# Patient Record
Sex: Female | Born: 1965 | Race: Black or African American | Hispanic: No | Marital: Married | Smoking: Current every day smoker
Health system: Southern US, Community
[De-identification: ages and names within clinical notes are randomized; demographics above are authoritative.]

## PROBLEM LIST (undated history)

## (undated) DIAGNOSIS — K589 Irritable bowel syndrome without diarrhea: Secondary | ICD-10-CM

## (undated) DIAGNOSIS — I10 Essential (primary) hypertension: Secondary | ICD-10-CM

## (undated) HISTORY — PX: ABDOMINAL HYSTERECTOMY: SHX81

## (undated) HISTORY — PX: LAPAROSCOPIC OOPHERECTOMY: SHX6507

## (undated) HISTORY — PX: CHOLECYSTECTOMY: SHX55

## (undated) HISTORY — PX: MENISCUS REPAIR: SHX5179

---

## 2013-08-21 ENCOUNTER — Ambulatory Visit: Payer: Self-pay | Admitting: Emergency Medicine

## 2015-02-02 ENCOUNTER — Ambulatory Visit
Admission: EM | Admit: 2015-02-02 | Discharge: 2015-02-02 | Disposition: A | Discharge status: Other Acute Inpatient Hospital | Payer: BLUE CROSS/BLUE SHIELD | Attending: Family Medicine | Admitting: Family Medicine

## 2015-02-02 DIAGNOSIS — S6990XA Unspecified injury of unspecified wrist, hand and finger(s), initial encounter: Secondary | ICD-10-CM

## 2015-02-02 HISTORY — DX: Essential (primary) hypertension: I10

## 2015-02-02 HISTORY — DX: Irritable bowel syndrome, unspecified: K58.9

## 2015-02-02 NOTE — ED Notes (Signed)
Pt reports she fell on concrete last Thursday. The affected finger is the middle finger on her left hand. Pt reports she noticed swelling and pain.

## 2015-02-27 IMAGING — CR RIGHT THUMB 2+V
1 series · 3 of 3 positions shown · non-contrast
Comparison: None.

CLINICAL DATA: Infection along thumb nail.  Pain.

EXAM:
RIGHT THUMB 2+V

[Series 1: pa · 0.17mm/px · 3 of 3 slices shown]
[im 1/3]
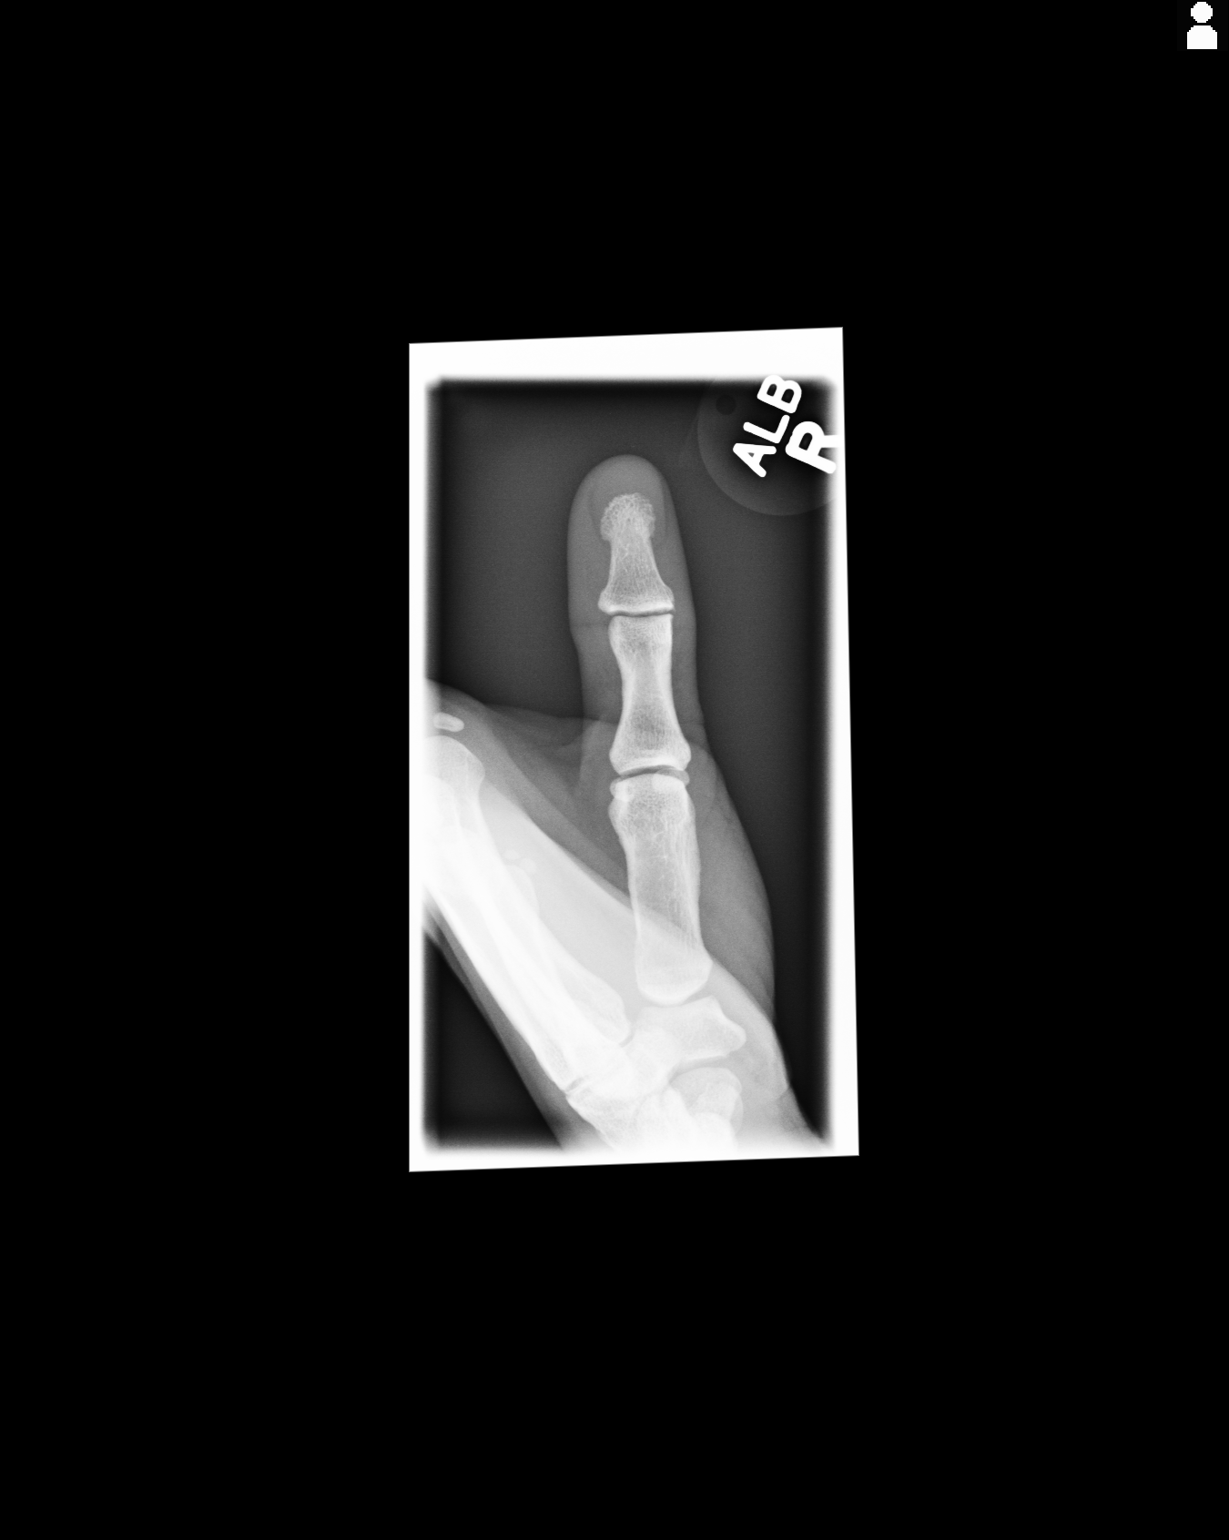
[im 2/3]
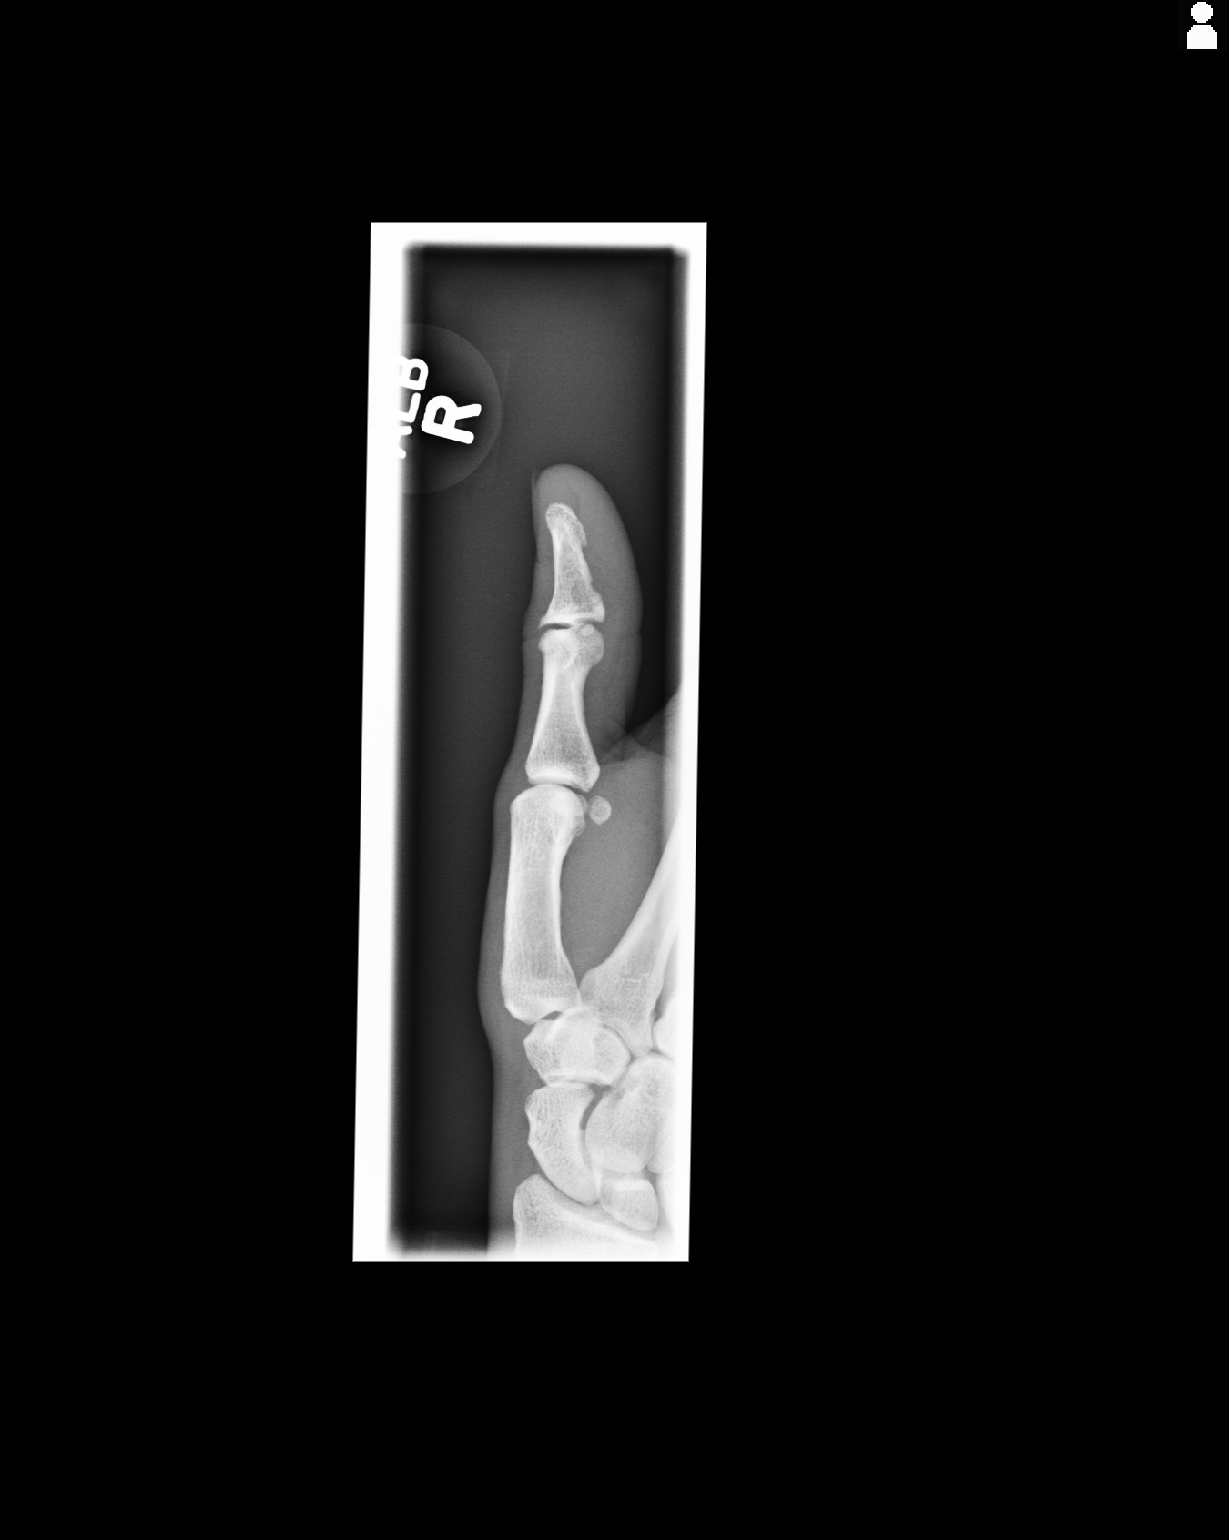
[im 3/3]
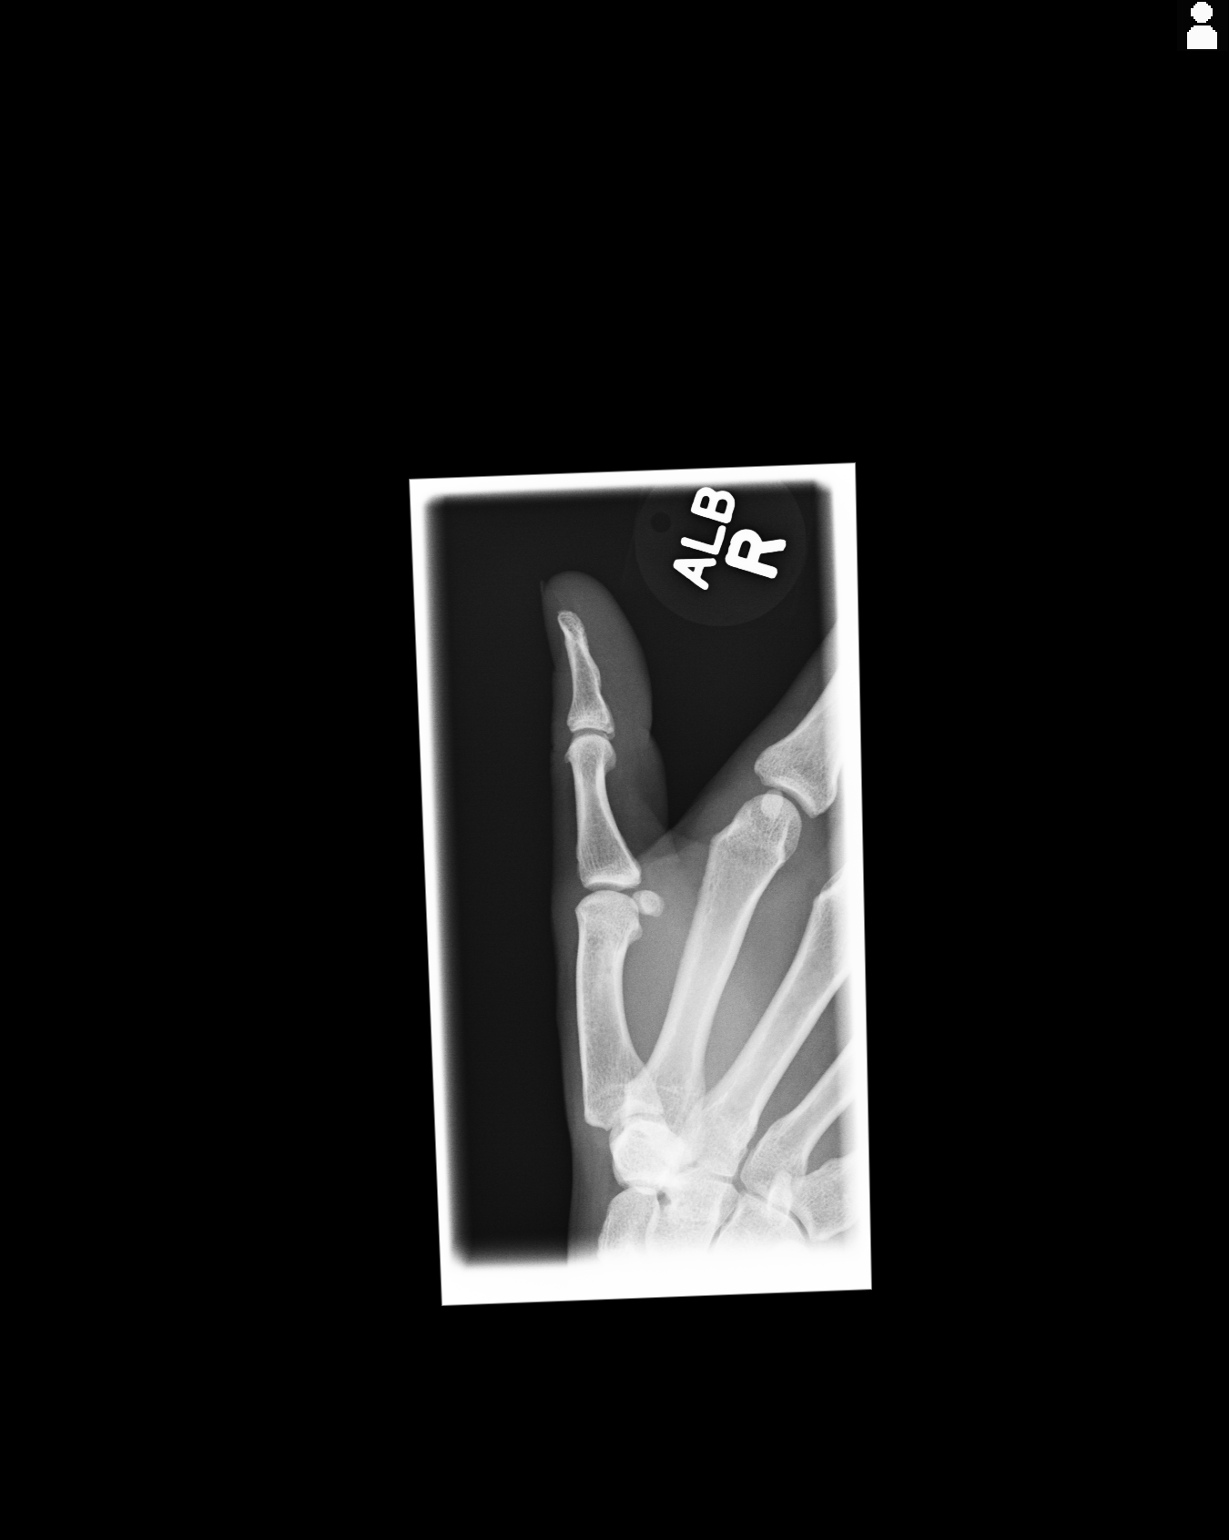

[3 of 3 positions shown; findings below may reference images not displayed]

FINDINGS: No bony destructive findings to suggest osteomyelitis. No gas is
observed in the soft tissues. Mild spurring of the interphalangeal
joint.
IMPRESSION: 1. No findings of osteomyelitis or gas in the soft tissues.
2. Minimal degenerative spurring of the interphalangeal joint of the
thumb.

## 2015-03-26 NOTE — ED Provider Notes (Signed)
CSN: [id number]     Arrival date & time 02/02/15  1732 History   First MD Initiated Contact with Patient 02/02/15 1819     Chief Complaint  Patient presents with  . Finger Injury   (Consider location/radiation/quality/duration/timing/severity/associated sxs/prior Treatment) HPI Comments: Pt reports she fell on concrete last Thursday. The affected finger is the middle finger on her left hand. Pt reports she noticed swelling and pain.       The history is provided by the patient.    Past Medical History  Diagnosis Date  . Hypertension   . Irritable bowel syndrome    Past Surgical History  Procedure Laterality Date  . Abdominal hysterectomy    . Meniscus repair Right   . Cholecystectomy    . Laparoscopic oopherectomy Left    No family history on file. Social History  Substance Use Topics  . Smoking status: Current Every Day Smoker -- 0.25 packs/day for 9 years  . Smokeless tobacco: Never Used  . Alcohol Use: Yes     Comment: occasionally   OB History    No data available     Review of Systems  Allergies  Sulfa antibiotics  Home Medications   Prior to Admission medications   Medication Sig Start Date End Date Taking? Authorizing Provider  escitalopram (LEXAPRO) 5 MG tablet Take 5 mg by mouth daily.   Yes Historical Provider, MD  hydrochlorothiazide (HYDRODIURIL) 25 MG tablet Take 25 mg by mouth daily.   Yes Historical Provider, MD  polyethylene glycol (MIRALAX / GLYCOLAX) packet Take 17 g by mouth daily.   Yes Historical Provider, MD  Probiotic Product (PROBIOTIC ADVANCED PO) Take by mouth.   Yes Historical Provider, MD   Meds Ordered and Administered this Visit  Medications - No data to display  BP 120/80 mmHg  Pulse 72  Temp(Src) 98.6 F (37 C) (Oral)  Resp 16  Ht 4' 5.75" (1.365 m)  Wt 125 lb (56.7 kg)  BMI 30.43 kg/m2  SpO2 100% No data found.   Physical Exam  ED Course  Procedures (including critical care time)  Labs Review Labs Reviewed -  No data to display  Imaging Review No results found.   Visual Acuity Review  Right Eye Distance:   Left Eye Distance:   Bilateral Distance:    Right Eye Near:   Left Eye Near:    Bilateral Near:         MDM   1. Finger injury, unspecified laterality, initial encounter       Discharge Medication List as of 02/02/2015  6:23 PM       Payton Mccallum, MD 06/17/15 [phone]
